# Patient Record
Sex: Male | Born: 2000 | Race: White | Hispanic: No | Marital: Single | Smoking: Never smoker
Health system: Southern US, Community
[De-identification: ages and names within clinical notes are randomized; demographics above are authoritative.]

---
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
## 2018-01-13 IMAGING — US US ABDOMEN LIMITED
1 series · 14 of 19 positions shown · non-contrast
Comparison: None.

CLINICAL DATA: Abdominal discomfort for 1 day.

EXAM:
LIMITED ABDOMINAL ULTRASOUND
TECHNIQUE: Gray scale imaging of the right lower quadrant was performed to
evaluate for suspected appendicitis. Standard imaging planes and
graded compression technique were utilized.

[Series 1: us abdomen limited · 0.10mm/px · 19 acquisitions, 14 frames shown]
[im 1/19]
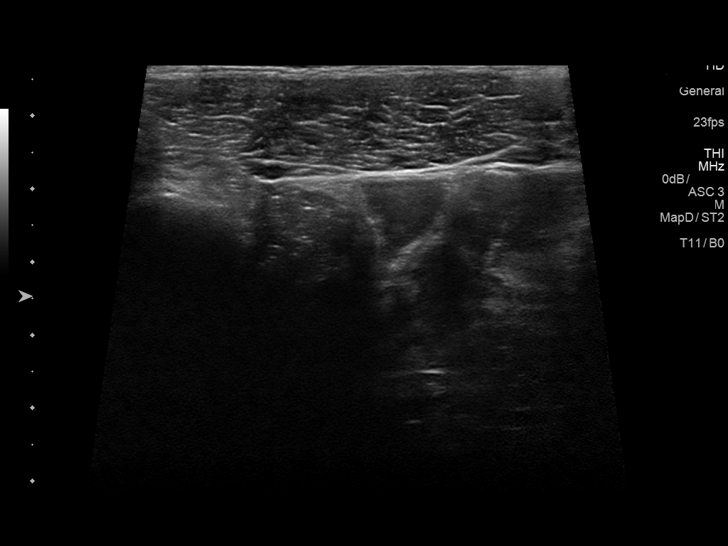
[im 3/19]
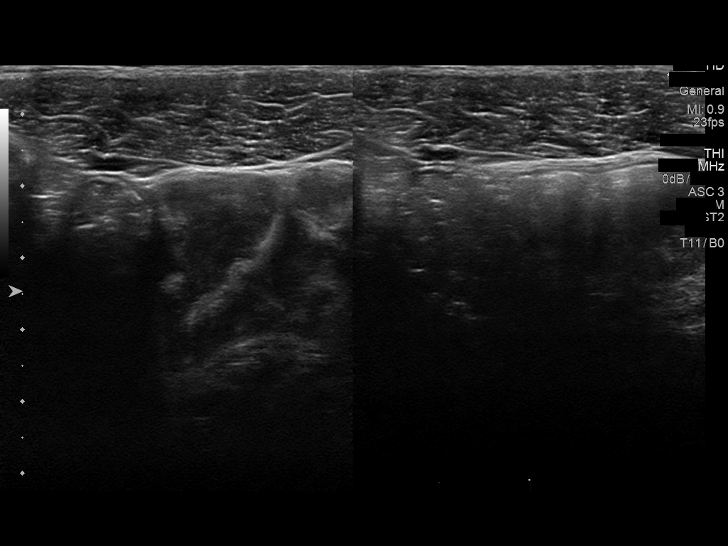
[im 4/19]
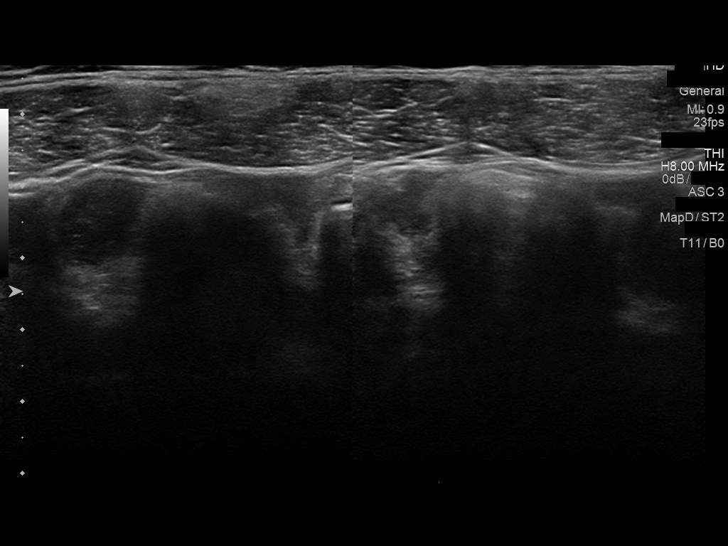
[im 5/19]
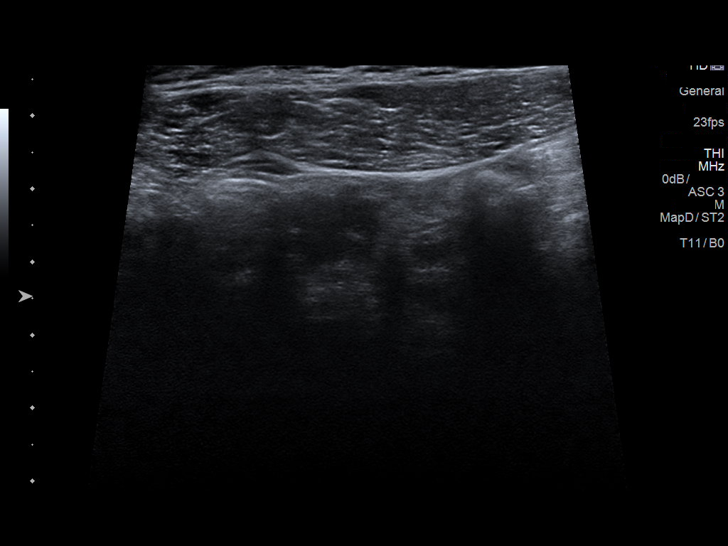
[im 7/19]
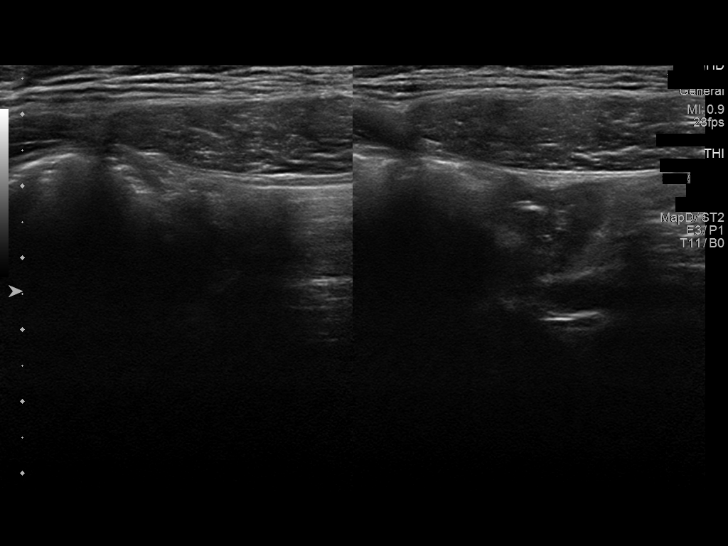
[im 8/19]
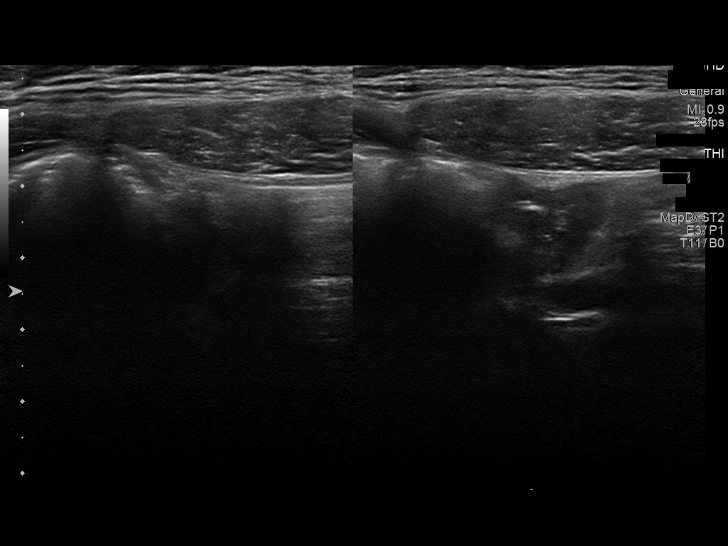
[im 9/19]
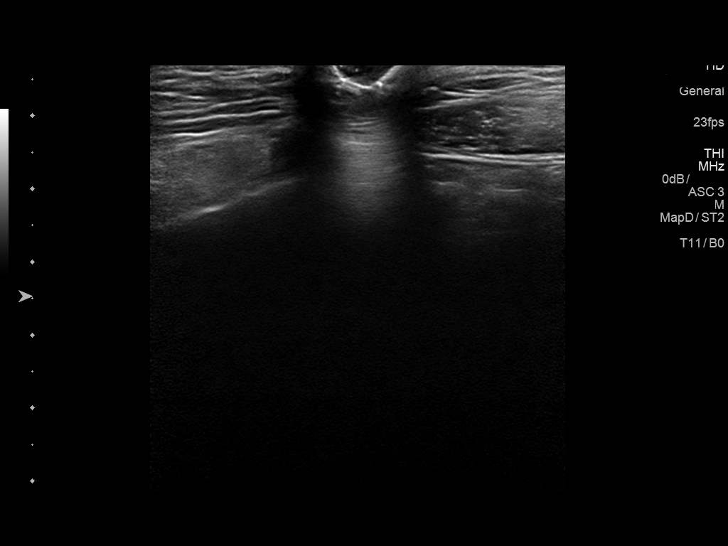
[im 11/19]
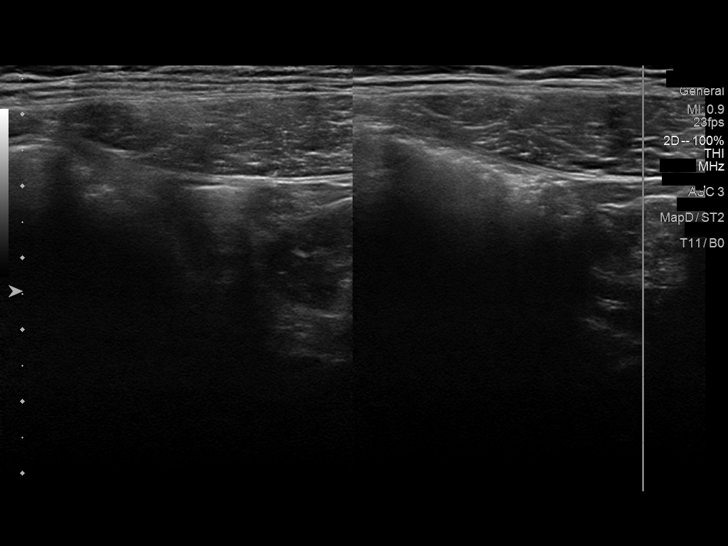
[im 12/19]
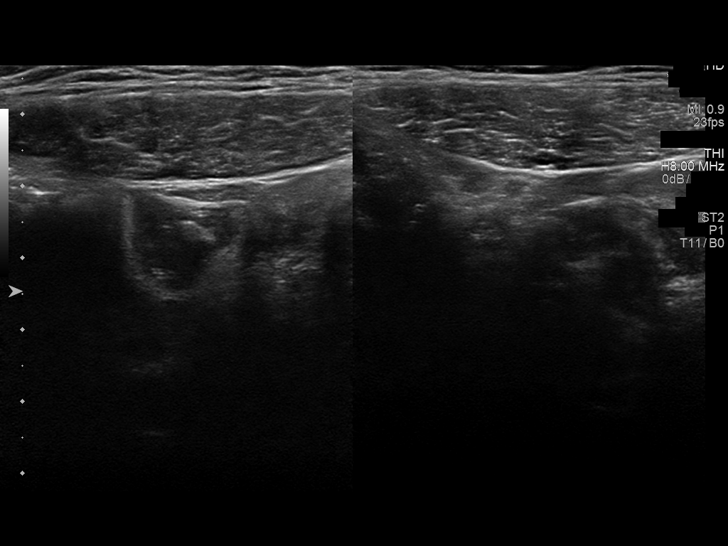
[im 13/19]
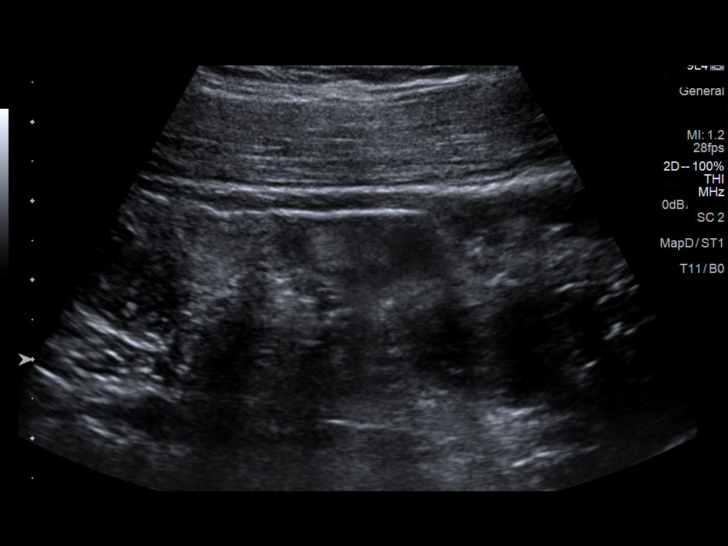
[im 15/19]
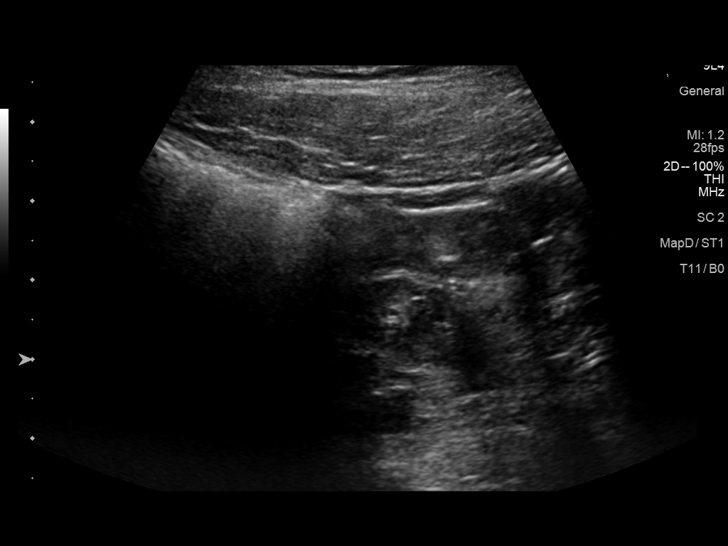
[im 16/19]
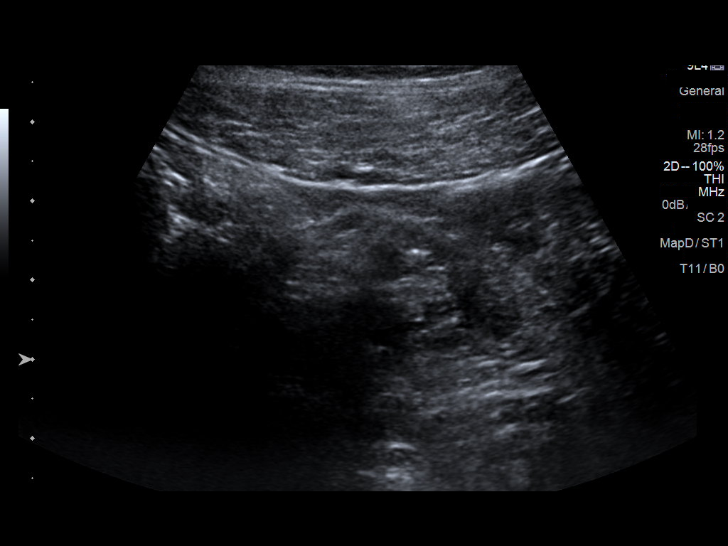
[im 17/19]
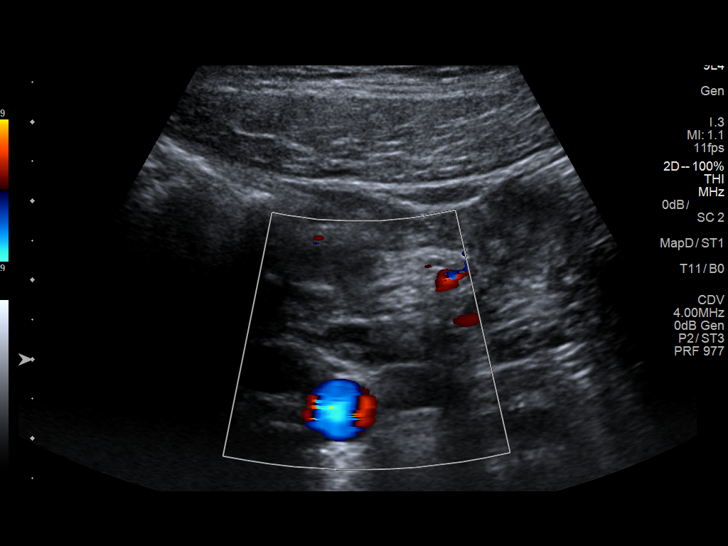
[im 19/19]
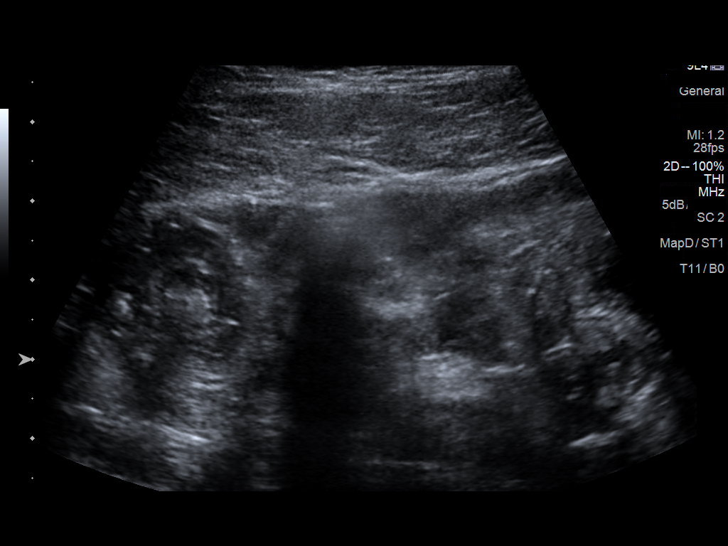

[14 of 19 positions shown; findings below may reference images not displayed]

FINDINGS: The appendix is not visualized.

Ancillary findings: None.  Peristalsing bowel noted in the abdomen.

Factors affecting image quality: None.
IMPRESSION: Appendix not visualized.

Note: Non-visualization of appendix by US does not definitely
exclude appendicitis. If there is sufficient clinical concern,
consider abdomen pelvis CT with contrast for further evaluation.

## 2019-12-22 ENCOUNTER — Ambulatory Visit: Payer: Managed Care, Other (non HMO) | Attending: Internal Medicine

## 2019-12-22 DIAGNOSIS — Z20822 Contact with and (suspected) exposure to covid-19: Secondary | ICD-10-CM

## 2019-12-24 LAB — NOVEL CORONAVIRUS, NAA: SARS-CoV-2, NAA: NOT DETECTED

## 2019-12-25 ENCOUNTER — Telehealth: Payer: Self-pay | Admitting: General Practice

## 2019-12-25 NOTE — Telephone Encounter (Signed)
Negative COVID results given. Patient results "NOT Detected." Caller expressed understanding. ° °

## 2019-12-30 ENCOUNTER — Other Ambulatory Visit: Payer: Managed Care, Other (non HMO)

## 2020-01-04 ENCOUNTER — Other Ambulatory Visit: Payer: Managed Care, Other (non HMO)

## 2020-01-04 ENCOUNTER — Ambulatory Visit: Payer: Managed Care, Other (non HMO) | Attending: Internal Medicine

## 2020-01-04 DIAGNOSIS — Z20822 Contact with and (suspected) exposure to covid-19: Secondary | ICD-10-CM

## 2020-01-05 LAB — NOVEL CORONAVIRUS, NAA: SARS-CoV-2, NAA: NOT DETECTED
